# Patient Record
Sex: Male | Born: 1975 | Race: White | Hispanic: No | Marital: Single
Health system: Southern US, Community
[De-identification: ages and names within clinical notes are randomized; demographics above are authoritative.]

---

## 2020-06-10 ENCOUNTER — Other Ambulatory Visit: Payer: Self-pay

## 2020-06-10 ENCOUNTER — Emergency Department (HOSPITAL_COMMUNITY): Payer: Self-pay

## 2020-06-10 ENCOUNTER — Emergency Department (HOSPITAL_COMMUNITY)
Admission: EM | Admit: 2020-06-10 | Discharge: 2020-06-11 | Disposition: A | Discharge status: Home / Self Care | Payer: Self-pay | Attending: Emergency Medicine | Admitting: Emergency Medicine

## 2020-06-10 ENCOUNTER — Emergency Department (HOSPITAL_COMMUNITY): Admission: EM | Admit: 2020-06-10 | Payer: Self-pay | Source: Home / Self Care

## 2020-06-10 DIAGNOSIS — E876 Hypokalemia: Secondary | ICD-10-CM | POA: Insufficient documentation

## 2020-06-10 DIAGNOSIS — T50901A Poisoning by unspecified drugs, medicaments and biological substances, accidental (unintentional), initial encounter: Secondary | ICD-10-CM | POA: Insufficient documentation

## 2020-06-10 DIAGNOSIS — Z0184 Encounter for antibody response examination: Secondary | ICD-10-CM | POA: Insufficient documentation

## 2020-06-10 DIAGNOSIS — Z20822 Contact with and (suspected) exposure to covid-19: Secondary | ICD-10-CM | POA: Insufficient documentation

## 2020-06-10 DIAGNOSIS — Y929 Unspecified place or not applicable: Secondary | ICD-10-CM | POA: Insufficient documentation

## 2020-06-10 DIAGNOSIS — Y999 Unspecified external cause status: Secondary | ICD-10-CM | POA: Insufficient documentation

## 2020-06-10 DIAGNOSIS — Y939 Activity, unspecified: Secondary | ICD-10-CM | POA: Insufficient documentation

## 2020-06-10 LAB — CBC WITH DIFFERENTIAL/PLATELET
Abs Immature Granulocytes: 0.1 10*3/uL — ABNORMAL HIGH (ref 0.00–0.07)
Basophils Absolute: 0.1 10*3/uL (ref 0.0–0.1)
Basophils Relative: 1 %
Eosinophils Absolute: 0.6 10*3/uL — ABNORMAL HIGH (ref 0.0–0.5)
Eosinophils Relative: 4 %
HCT: 43.2 % (ref 39.0–52.0)
Hemoglobin: 13.9 g/dL (ref 13.0–17.0)
Immature Granulocytes: 1 %
Lymphocytes Relative: 36 %
Lymphs Abs: 6 10*3/uL — ABNORMAL HIGH (ref 0.7–4.0)
MCH: 29.5 pg (ref 26.0–34.0)
MCHC: 32.2 g/dL (ref 30.0–36.0)
MCV: 91.7 fL (ref 80.0–100.0)
Monocytes Absolute: 1.1 10*3/uL — ABNORMAL HIGH (ref 0.1–1.0)
Monocytes Relative: 6 %
Neutro Abs: 8.8 10*3/uL — ABNORMAL HIGH (ref 1.7–7.7)
Neutrophils Relative %: 52 %
Platelets: 422 10*3/uL — ABNORMAL HIGH (ref 150–400)
RBC: 4.71 MIL/uL (ref 4.22–5.81)
RDW: 14.6 % (ref 11.5–15.5)
WBC: 16.8 10*3/uL — ABNORMAL HIGH (ref 4.0–10.5)
nRBC: 0 % (ref 0.0–0.2)

## 2020-06-10 LAB — COMPREHENSIVE METABOLIC PANEL
ALT: 43 U/L (ref 0–44)
AST: 41 U/L (ref 15–41)
Albumin: 4.2 g/dL (ref 3.5–5.0)
Alkaline Phosphatase: 55 U/L (ref 38–126)
Anion gap: 16 — ABNORMAL HIGH (ref 5–15)
BUN: 15 mg/dL (ref 6–20)
CO2: 19 mmol/L — ABNORMAL LOW (ref 22–32)
Calcium: 8.9 mg/dL (ref 8.9–10.3)
Chloride: 104 mmol/L (ref 98–111)
Creatinine, Ser: 1.22 mg/dL (ref 0.61–1.24)
GFR, Estimated: >60 mL/min (ref >60)
Glucose, Bld: 199 mg/dL — ABNORMAL HIGH (ref 70–99)
Potassium: 2.5 mmol/L — CL (ref 3.5–5.1)
Sodium: 139 mmol/L (ref 135–145)
Total Bilirubin: 0.5 mg/dL (ref 0.3–1.2)
Total Protein: 7.7 g/dL (ref 6.5–8.1)

## 2020-06-10 LAB — URINALYSIS, ROUTINE W REFLEX MICROSCOPIC
Bacteria, UA: NONE SEEN
Bilirubin Urine: NEGATIVE
Glucose, UA: NEGATIVE mg/dL
Ketones, ur: NEGATIVE mg/dL
Leukocytes,Ua: NEGATIVE
Nitrite: NEGATIVE
Protein, ur: NEGATIVE mg/dL
Specific Gravity, Urine: 1.005 (ref 1.005–1.030)
pH: 6 (ref 5.0–8.0)

## 2020-06-10 LAB — LACTIC ACID, PLASMA
Lactic Acid, Venous: 2.3 mmol/L (ref 0.5–1.9)
Lactic Acid, Venous: 0.7 mmol/L (ref 0.5–1.9)

## 2020-06-10 LAB — RAPID URINE DRUG SCREEN, HOSP PERFORMED
Amphetamines: NOT DETECTED
Barbiturates: NOT DETECTED
Benzodiazepines: NOT DETECTED
Cocaine: NOT DETECTED
Opiates: NOT DETECTED
Tetrahydrocannabinol: NOT DETECTED

## 2020-06-10 LAB — RESP PANEL BY RT-PCR (FLU A&B, COVID) ARPGX2
Influenza A by PCR: NEGATIVE
Influenza B by PCR: NEGATIVE
SARS Coronavirus 2 by RT PCR: NEGATIVE

## 2020-06-10 LAB — TROPONIN I (HIGH SENSITIVITY)
Troponin I (High Sensitivity): 4 ng/L (ref <18)
Troponin I (High Sensitivity): 13 ng/L (ref <18)

## 2020-06-10 LAB — BRAIN NATRIURETIC PEPTIDE: B Natriuretic Peptide: 26 pg/mL (ref 0.0–100.0)

## 2020-06-10 LAB — APTT: aPTT: 25 seconds (ref 24–36)

## 2020-06-10 LAB — PROTIME-INR
INR: 1 (ref 0.8–1.2)
Prothrombin Time: 12.8 seconds (ref 11.4–15.2)

## 2020-06-10 LAB — ETHANOL: Alcohol, Ethyl (B): 15 mg/dL — ABNORMAL HIGH (ref <10)

## 2020-06-10 LAB — POC SARS CORONAVIRUS 2 AG -  ED: SARS Coronavirus 2 Ag: NEGATIVE

## 2020-06-10 MED ORDER — SODIUM CHLORIDE 0.9 % IV BOLUS (SEPSIS)
1000.0000 mL | Freq: Once | INTRAVENOUS | Status: AC
  Administered 2020-06-10: 1000 mL via INTRAVENOUS

## 2020-06-10 MED ORDER — ONDANSETRON HCL 4 MG/2ML IJ SOLN
4.0000 mg | Freq: Once | INTRAMUSCULAR | Status: AC
  Administered 2020-06-10: 4 mg via INTRAVENOUS
  Filled 2020-06-10: qty 2

## 2020-06-10 MED ORDER — POTASSIUM CHLORIDE CRYS ER 20 MEQ PO TBCR
40.0000 meq | EXTENDED_RELEASE_TABLET | Freq: Once | ORAL | Status: AC
  Administered 2020-06-10: 40 meq via ORAL
  Filled 2020-06-10: qty 2

## 2020-06-10 MED ORDER — AMOXICILLIN-POT CLAVULANATE 875-125 MG PO TABS: 1.0000 | ORAL_TABLET | Freq: Two times a day (BID) | ORAL | 0 refills | Status: AC

## 2020-06-10 MED ORDER — POTASSIUM CHLORIDE 10 MEQ/100ML IV SOLN
10.0000 meq | Freq: Once | INTRAVENOUS | Status: AC
  Administered 2020-06-10: 10 meq via INTRAVENOUS
  Filled 2020-06-10: qty 100

## 2020-06-10 NOTE — ED Notes (Signed)
Date and time results received: 06/10/20 2131  Test: potassium Critical Value: 2.5  Name of Provider Notified: Dr. Estell Harpin  Orders Received? Or Actions Taken?: n/a

## 2020-06-10 NOTE — ED Notes (Signed)
Pt arrives to assigned room by stretcher with patient having assisted ventilations by BVM. Received 4mg  Nasal Narcan at this time.   2002: Pt received 2mg  Narcan IVx1 dose. Pt placed on cardiac monitor, and crash cart at this time.   2003: Pt on monitor, HR 129, SpO2 100 via BVM. Pt remains unresponsive at this time. CBG 195  2005: Pt arousable to voice at this time. Pt oriented to self, disoriented to place, time and situation. Staff remains at bedside at this time.   2006: BP 154/120 MAP 130 18G IV placed to the left hand by ER staff and labs collected at this time.   2008: Verbal Order received for 1L Normal Saline and and Temp Foley.

## 2020-06-10 NOTE — ED Notes (Signed)
Pt stated to this RN that "he sniffed a line of something that he thought was cocaine, took an oxy earlier today, and drank a 6 pack." MD notified

## 2020-06-10 NOTE — ED Notes (Signed)
Large amount of money and wallet counted and secured by security. Security slip with this Charity fundraiser. Patient notified
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
## 2020-06-10 NOTE — ED Notes (Signed)
Pt's belongings given back to patient by security

## 2020-06-10 NOTE — ED Notes (Signed)
Mother at bedside.

## 2020-06-10 NOTE — Progress Notes (Signed)
Patient bagged briefly until he started breathing on his own and coming to. Patient placed on 4 lpm nasal cannula. MD and nursing staff at bedside when RT left room.

## 2020-06-10 NOTE — Discharge Instructions (Addendum)
Follow-up with the family doctor next week.  Return if any problems.  You will need to get your potassium rechecked next week

## 2020-06-10 NOTE — ED Notes (Signed)
Date and time results received: 06/10/20 2207  Test: Lactic Acid Critical Value: 2.3  Name of Provider Notified: Dr. Estell Harpin  Orders Received? Or Actions Taken?: No new orders received

## 2020-06-11 LAB — CBG MONITORING, ED: Glucose-Capillary: 195 mg/dL — ABNORMAL HIGH (ref 70–99)

## 2020-06-12 LAB — URINE CULTURE: Culture: NO GROWTH

## 2020-06-14 LAB — SAR COV2 SEROLOGY (COVID19)AB(IGG),IA: SARS-CoV-2 Ab, IgG: REACTIVE — AB

## 2020-06-15 LAB — CULTURE, BLOOD (SINGLE)
Culture: NO GROWTH
Special Requests: ADEQUATE

## 2022-10-19 IMAGING — DX DG CHEST 1V PORT
1 series · 1 of 1 positions shown · non-contrast
Comparison: None.

CLINICAL DATA: Unresponsive.

EXAM:
PORTABLE CHEST 1 VIEW

[chest ap grid]
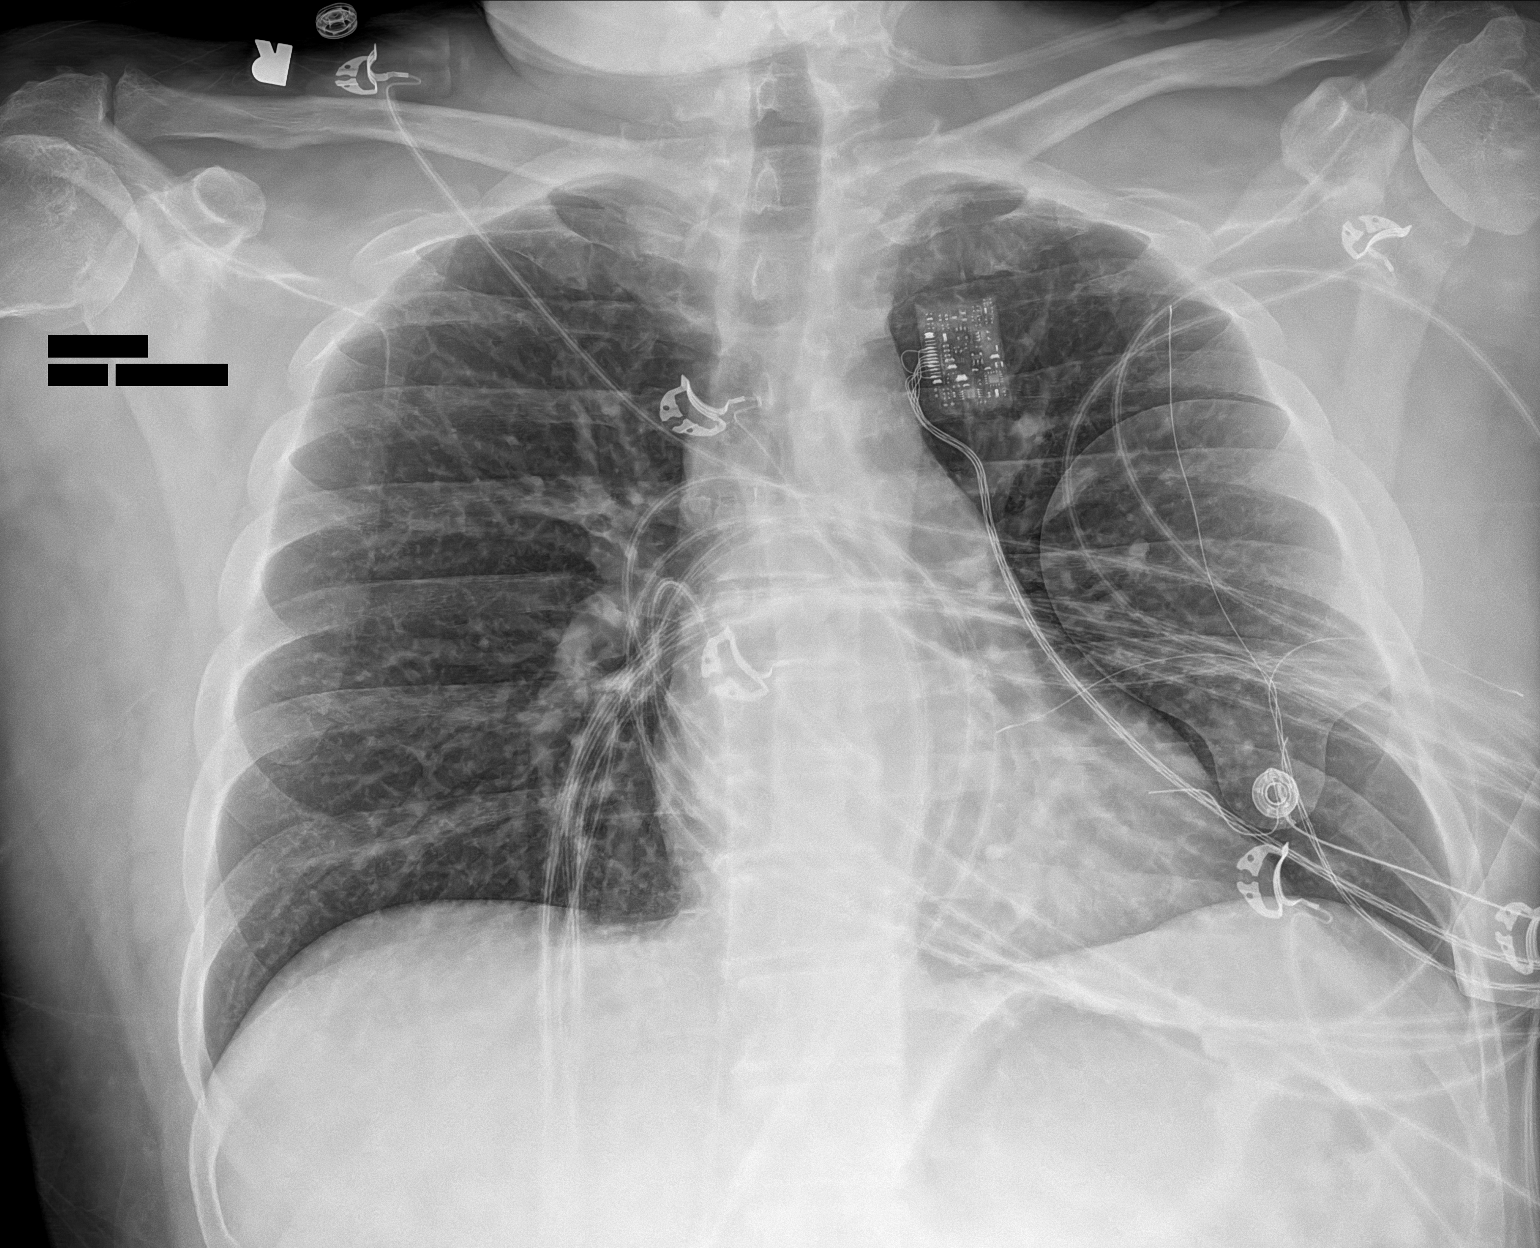

[1 of 1 positions shown; findings below may reference images not displayed]

FINDINGS: The heart size and mediastinal contours are within normal limits.
Both lungs are clear. The visualized skeletal structures are
unremarkable.
IMPRESSION: No active disease.
